# Patient Record
Sex: Female | Born: 2005
Health system: Southern US, Community
[De-identification: ages and names within clinical notes are randomized; demographics above are authoritative.]

## PROBLEM LIST (undated history)

## (undated) DIAGNOSIS — Q529 Congenital malformation of female genitalia, unspecified: Secondary | ICD-10-CM

## (undated) DIAGNOSIS — Q524 Other congenital malformations of vagina: Secondary | ICD-10-CM

## (undated) DIAGNOSIS — D580 Hereditary spherocytosis: Secondary | ICD-10-CM

---
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
## 2023-01-24 ENCOUNTER — Encounter (HOSPITAL_BASED_OUTPATIENT_CLINIC_OR_DEPARTMENT_OTHER): Payer: Self-pay | Admitting: Obstetrics & Gynecology

## 2023-01-24 ENCOUNTER — Other Ambulatory Visit (HOSPITAL_COMMUNITY)
Admission: RE | Admit: 2023-01-24 | Discharge: 2023-01-24 | Disposition: A | Discharge status: Home / Self Care | Payer: BC Managed Care – PPO | Source: Ambulatory Visit | Attending: Obstetrics & Gynecology | Admitting: Obstetrics & Gynecology

## 2023-01-24 ENCOUNTER — Ambulatory Visit (HOSPITAL_BASED_OUTPATIENT_CLINIC_OR_DEPARTMENT_OTHER): Payer: BC Managed Care – PPO | Admitting: Obstetrics & Gynecology

## 2023-01-24 VITALS — BP 135/77 | HR 66 | Ht 64.0 in | Wt 181.6 lb

## 2023-01-24 DIAGNOSIS — N9089 Other specified noninflammatory disorders of vulva and perineum: Secondary | ICD-10-CM

## 2023-01-24 MED ORDER — ESTRADIOL 0.1 MG/GM VA CREA: TOPICAL_CREAM | VAGINAL | 1 refills | Status: AC

## 2023-01-24 NOTE — Progress Notes (Unsigned)
GYNECOLOGY  VISIT  CC:   difficulty with tampon use  HPI: 17 y.o. 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  She has used a tampon in the past but feels she was only able to use a light one.  First day or two are heavy and so a light day tampon didn't really help.  Cycles last 4 days total.  She typically has cramping right before cycles start and maybe the first day of her cycle.     Past Medical History:  Diagnosis Date   Hereditary spherocytosis (HCC)    Hymen abnormality     MEDS:   Current Outpatient Medications on File Prior to Visit  Medication Sig Dispense Refill   FOLIC ACID PO Take 2 mg by mouth daily.     Multiple Vitamin (MULTIVITAMIN ADULT) TABS Take 1 tablet by mouth daily.     docusate sodium (COLACE) 100 MG capsule Take 1 capsule (100 mg total) by mouth daily as needed for moderate constipation or mild constipation. (Patient not taking: Reported on 01/24/2023)     polyethylene glycol (MIRALAX / GLYCOLAX) 17 g packet Take 17 g by mouth daily as needed for moderate constipation. (Patient not taking: Reported on 01/24/2023)  0   No current facility-administered medications on file prior to visit.    ALLERGIES: Patient has no known allergies.  SH:  single, non smoker  Review of Systems  Constitutional: Negative.   Genitourinary: Negative.     PHYSICAL EXAMINATION:    BP 135/77 (BP Location: Right Arm, Patient Position: Sitting, Cuff Size: Large)   Pulse 66   Ht 5\' 4"  (1.626 m) Comment: Reported  Wt 181 lb 9.6 oz (82.4 kg)   LMP 01/02/2023   BMI 31.17 kg/m     Physical Exam Constitutional:      Appearance: Normal appearance.  Genitourinary:    Labia:        Right: No rash, tenderness or lesion.        Left: No rash, tenderness or lesion.        Comments: Hymemal ring is still intact.  Can easily pass on finger.  Two fingers is tight. Lymphadenopathy:     Lower  Body: No right inguinal adenopathy. No left inguinal adenopathy.  Neurological:     General: No focal deficit present.     Mental Status: She is alert.  Psychiatric:        Mood and Affect: Mood normal.    Chaperone, Ina Homes, CMA, was present for exam.  Assessment/Plan: 1. Vulvar irritation - discussed with pt and her mother physical exam findings (and showed pt with a mirror).  With exam today, she should be able to pass a tampon but I think the skin tenderness/irritation is creating so much tenderness that it is preventing tampon use. - advised to use dye free/scent free products.  Advised to use similar type feminine products.   - will test for vaginitis.  Cervicovaginal ancillary only( [place]) - start topical estrogen cream at least twice weekly.  Location for application showed to pt. - recheck 1 month.  Total time with pt and her mother after exam was completed:  33 minutes. Documentation time:  5 minutes Total time:  38 minutes

## 2023-01-25 LAB — CERVICOVAGINAL ANCILLARY ONLY
Bacterial Vaginitis (gardnerella): NEGATIVE
Candida Glabrata: NEGATIVE
Candida Vaginitis: NEGATIVE
Comment: NEGATIVE
Comment: NEGATIVE
Comment: NEGATIVE

## 2023-02-08 ENCOUNTER — Encounter: Payer: Self-pay | Admitting: Family Medicine

## 2023-02-08 ENCOUNTER — Ambulatory Visit (INDEPENDENT_AMBULATORY_CARE_PROVIDER_SITE_OTHER): Payer: BC Managed Care – PPO | Admitting: Family Medicine

## 2023-02-08 VITALS — BP 107/70 | HR 67 | Ht 64.0 in | Wt 180.1 lb

## 2023-02-08 DIAGNOSIS — Z0000 Encounter for general adult medical examination without abnormal findings: Secondary | ICD-10-CM

## 2023-02-08 DIAGNOSIS — Z111 Encounter for screening for respiratory tuberculosis: Secondary | ICD-10-CM

## 2023-02-08 DIAGNOSIS — D580 Hereditary spherocytosis: Secondary | ICD-10-CM | POA: Diagnosis not present

## 2023-02-08 DIAGNOSIS — Z9049 Acquired absence of other specified parts of digestive tract: Secondary | ICD-10-CM | POA: Diagnosis not present

## 2023-02-08 DIAGNOSIS — R17 Unspecified jaundice: Secondary | ICD-10-CM

## 2023-02-08 NOTE — Progress Notes (Signed)
   Annual physical  Subjective   Patient ID: Kimberly James, female    DOB: Feb 25, 2006  Age: 17 y.o. MRN: [id number]  Chief Complaint  Patient presents with   Annual Exam   HPI Tyshema is a 17 y.o. old female here  for annual exam.   Changes in his/her health in the last 12 months: no  Patient currently works for the [employer].  She is attempting to go to [employer].  Needs paperwork filled out for this.  She is single, not currently sexually active.  Not on birth control.  Having regular menstrual cycles.  Last one was October 20.  She does not use tobacco, does not drink alcohol or use recreational drugs.  Patient eats a regular diet.  Exercises occasionally.  Patient has no questions or concerns.  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  She has had a poor reaction to previous flu vaccination when she was younger.  She has hereditary spherocytosis.  We discussed contraindications.  Do not see any contraindication to the flu shot for spherocytosis, but advised I could document that she had a reaction to flu vaccine in the past.   The ASCVD Risk score (Arnett DK, et al., 2019) failed to calculate for the following reasons:   The 2019 ASCVD risk score is only valid for ages 78 to 84  Health Maintenance Due  Topic Date Due   COVID-19 Vaccine (1 - 2023-24 season) Never done      Objective:     BP 107/70   Pulse 67   Ht 5\' 4"  (1.626 m)   Wt 180 lb 1.9 oz (81.7 kg)   LMP 01/28/2023   SpO2 100%   BMI 30.92 kg/m    Physical Exam General: Alert, oriented H&T: PERRLA, EOMI, moist mucosa CV: Regular in rhythm no murmurs Pulmonary: Lungs are bilaterally no wheeze or crackles GI: Soft, normal bowel sounds MSK: Strength equal bilaterally Extremities: No pedal edema Skin: Warm and dry Psych: Pleasant affect.   No results found for any visits on 02/08/23.      Assessment & Plan:   Physical exam,  annual  Screening for tuberculosis -     QuantiFERON-TB Gold Plus; Future  S/P cholecystectomy Assessment & Plan: In March of this year patient had cholecystectomy.  Not experiencing any complications from this procedure.   Hereditary spherocytosis (HCC) Assessment & Plan: Discussed with patient importance of vaccinations and people who may be immunocompromised.  Patient still has spleen.  Mild anemia and hyperbilirubinemia may be secondary to severe cytosis.  Bilirubinemia may be due to the choledocholithiasis she was experiencing.  Will need to repeat CMP.      Return in about 1 year (around 02/08/2024) for physical.    Sandre Kitty, MD

## 2023-02-08 NOTE — Assessment & Plan Note (Signed)
Discussed with patient importance of vaccinations and people who may be immunocompromised.  Patient still has spleen.  Mild anemia and hyperbilirubinemia may be secondary to severe cytosis.  Bilirubinemia may be due to the choledocholithiasis she was experiencing.  Will need to repeat CMP.

## 2023-02-08 NOTE — Patient Instructions (Signed)
It was nice to see you today,  We addressed the following topics today: -Please come back to get your tuberculosis test at your earliest convenience.  We can fill out the rest of the paperwork after it is documented.  Have a great day,  Frederic Jericho, MD

## 2023-02-08 NOTE — Assessment & Plan Note (Signed)
In March of this year patient had cholecystectomy.  Not experiencing any complications from this procedure.

## 2023-02-12 ENCOUNTER — Other Ambulatory Visit: Payer: BC Managed Care – PPO

## 2023-02-12 DIAGNOSIS — Z111 Encounter for screening for respiratory tuberculosis: Secondary | ICD-10-CM

## 2023-02-12 DIAGNOSIS — R17 Unspecified jaundice: Secondary | ICD-10-CM

## 2023-02-16 ENCOUNTER — Telehealth: Payer: Self-pay

## 2023-02-16 LAB — COMPREHENSIVE METABOLIC PANEL
ALT: 22 [IU]/L (ref 0–32)
AST: 17 [IU]/L (ref 0–40)
Albumin: 4.9 g/dL (ref 4.0–5.0)
Alkaline Phosphatase: 71 [IU]/L (ref 42–106)
BUN/Creatinine Ratio: 11 (ref 9–23)
BUN: 9 mg/dL (ref 6–20)
Bilirubin Total: 1.9 mg/dL — ABNORMAL HIGH (ref 0.0–1.2)
CO2: 20 mmol/L (ref 20–29)
Calcium: 9.5 mg/dL (ref 8.7–10.2)
Chloride: 105 mmol/L (ref 96–106)
Creatinine, Ser: 0.79 mg/dL (ref 0.57–1.00)
Globulin, Total: 1.9 g/dL (ref 1.5–4.5)
Glucose: 97 mg/dL (ref 70–99)
Potassium: 4.1 mmol/L (ref 3.5–5.2)
Sodium: 141 mmol/L (ref 134–144)
Total Protein: 6.8 g/dL (ref 6.0–8.5)
eGFR: 110 mL/min/{1.73_m2} (ref >59)

## 2023-02-16 LAB — QUANTIFERON-TB GOLD PLUS
QuantiFERON Mitogen Value: >10 [IU]/mL
QuantiFERON Nil Value: 0.01 [IU]/mL
QuantiFERON TB1 Ag Value: 0.01 [IU]/mL
QuantiFERON TB2 Ag Value: 0.01 [IU]/mL
QuantiFERON-TB Gold Plus: NEGATIVE

## 2023-02-16 NOTE — Telephone Encounter (Signed)
-----   Message from Sandre Kitty sent at 02/16/2023  7:08 AM EST ----- Negative QuantiFERON.  Please call the patient to let her know that we can finish her paperwork now.

## 2023-02-16 NOTE — Telephone Encounter (Signed)
Pt will try to stop in today with paperwork.

## 2023-02-28 ENCOUNTER — Encounter (HOSPITAL_BASED_OUTPATIENT_CLINIC_OR_DEPARTMENT_OTHER): Payer: Self-pay | Admitting: Obstetrics & Gynecology

## 2023-02-28 ENCOUNTER — Ambulatory Visit (HOSPITAL_BASED_OUTPATIENT_CLINIC_OR_DEPARTMENT_OTHER): Payer: BC Managed Care – PPO | Admitting: Obstetrics & Gynecology

## 2023-02-28 VITALS — BP 123/53 | HR 71 | Ht 64.0 in | Wt 186.4 lb

## 2023-02-28 DIAGNOSIS — M6289 Other specified disorders of muscle: Secondary | ICD-10-CM

## 2023-02-28 DIAGNOSIS — N9089 Other specified noninflammatory disorders of vulva and perineum: Secondary | ICD-10-CM | POA: Diagnosis not present

## 2023-02-28 NOTE — Progress Notes (Unsigned)
GYNECOLOGY  VISIT  CC:   vulvar recheck  HPI: 17 y.o. G0P0000 Single White or Caucasian female here for vulvar recheck.  She is using topical estrogen cream that she is applying with her finger.  She does think there is some decreased sensitivity.  She has not tried to place a tampon since I saw her last. She has not other complaints.  She is getting ready to start a more intensive training that will last several months.   Past Medical History:  Diagnosis Date   Hereditary spherocytosis (HCC)    Hymen abnormality     MEDS:   Current Outpatient Medications on File Prior to Visit  Medication Sig Dispense Refill   estradiol (ESTRACE) 0.1 MG/GM vaginal cream Apply topically as directed at least twice weekly 42.5 g 1   FOLIC ACID PO Take 2 mg by mouth daily.     Multiple Vitamin (MULTIVITAMIN ADULT) TABS Take 1 tablet by mouth daily.     docusate sodium (COLACE) 100 MG capsule Take 1 capsule (100 mg total) by mouth daily as needed for moderate constipation or mild constipation. (Patient not taking: Reported on 01/24/2023)     polyethylene glycol (MIRALAX / GLYCOLAX) 17 g packet Take 17 g by mouth daily as needed for moderate constipation. (Patient not taking: Reported on 01/24/2023)  0   No current facility-administered medications on file prior to visit.    ALLERGIES: Hydrocodone  SH:  single non smoker  Review of Systems  Constitutional: Negative.   Genitourinary: Negative.     PHYSICAL EXAMINATION:    BP (!) 123/53 (BP Location: Right Arm, Patient Position: Sitting, Cuff Size: Large)   Pulse 71   Ht 5\' 4"  (1.626 m)   Wt 186 lb 6.4 oz (84.6 kg)   LMP 01/28/2023   BMI 32.00 kg/m     General appearance: alert, cooperative and appears stated age Lymph:  no inguinal LAD noted  Pelvic: External genitalia:  no lesions, decreased erythema of vulvar skin              Urethra:  normal appearing urethra with no masses, tenderness or lesions              Bartholins and Skenes:  normal                 Vagina:  continued tenderness of perineal body especially when palpation of  deeper perineal muscle/tissue             Pt declined chaperon for exam for exam.  Assessment/Plan: 1. Vulvar irritation - continue using vaginal estrogen cream externally, once or twice weekly.  Does not need RF right now  2. Pelvic floor dysfunction - I do feel she would really benefit some pelvic PT.  At this moment, she does not feel she has the time to be able to do this with a change in her schedule.  Discussed it's ok to wait as doesn't have daily pain.  Feel ok for her to get through some of her training and then let me know when will have a little more time in schedule and then I can place the referral.  She agreed with plan.  Total time with pt:  18 minutes Time documentation:  4 minutes Total time:  22 minutes

## 2023-03-01 DIAGNOSIS — M6289 Other specified disorders of muscle: Secondary | ICD-10-CM | POA: Insufficient documentation

## 2023-11-09 ENCOUNTER — Ambulatory Visit (HOSPITAL_BASED_OUTPATIENT_CLINIC_OR_DEPARTMENT_OTHER): Payer: BC Managed Care – PPO | Admitting: Obstetrics & Gynecology

## 2024-02-11 ENCOUNTER — Encounter: Payer: BC Managed Care – PPO | Admitting: Family Medicine

## 2024-02-21 ENCOUNTER — Encounter: Admitting: Family Medicine

## 2024-11-12 ENCOUNTER — Ambulatory Visit (HOSPITAL_BASED_OUTPATIENT_CLINIC_OR_DEPARTMENT_OTHER): Payer: Self-pay | Admitting: Obstetrics & Gynecology
